# Patient Record
Sex: Female | Born: 1960 | Race: Black or African American | Hispanic: No
Health system: Southern US, Community
[De-identification: ages and names within clinical notes are randomized; demographics above are authoritative.]

---

## 2008-05-15 ENCOUNTER — Ambulatory Visit: Payer: Self-pay | Admitting: Family Medicine

## 2008-05-15 LAB — CONVERTED CEMR LAB
Basophils Absolute: 0 10*3/uL (ref 0.0–0.1)
CO2: 24 meq/L (ref 19–32)
Cholesterol: 215 mg/dL — ABNORMAL HIGH (ref 0–200)
Creatinine, Ser: 0.8 mg/dL (ref 0.40–1.20)
Eosinophils Relative: 1 % (ref 0–5)
Glucose, Bld: 79 mg/dL (ref 70–99)
HCT: 33 % — ABNORMAL LOW (ref 36.0–46.0)
Hemoglobin: 10.4 g/dL — ABNORMAL LOW (ref 12.0–15.0)
Lymphocytes Relative: 43 % (ref 12–46)
Lymphs Abs: 1.7 10*3/uL (ref 0.7–4.0)
MCV: 77.1 fL — ABNORMAL LOW (ref 78.0–100.0)
Monocytes Absolute: 0.3 10*3/uL (ref 0.1–1.0)
RDW: 15.8 % — ABNORMAL HIGH (ref 11.5–15.5)
Total Bilirubin: 1.5 mg/dL — ABNORMAL HIGH (ref 0.3–1.2)
Total CHOL/HDL Ratio: 4.6
Triglycerides: 111 mg/dL (ref <150)
VLDL: 22 mg/dL (ref 0–40)

## 2008-05-16 ENCOUNTER — Ambulatory Visit (HOSPITAL_COMMUNITY): Admission: RE | Admit: 2008-05-16 | Discharge: 2008-05-16 | Payer: Self-pay | Admitting: Family Medicine

## 2008-05-16 ENCOUNTER — Encounter: Payer: Self-pay | Admitting: Family Medicine

## 2008-05-17 ENCOUNTER — Encounter: Payer: Self-pay | Admitting: Family Medicine

## 2008-05-17 LAB — CONVERTED CEMR LAB
Ferritin: 8 ng/mL — ABNORMAL LOW (ref 10–291)
Saturation Ratios: 33 % (ref 20–55)

## 2008-05-26 ENCOUNTER — Emergency Department (HOSPITAL_COMMUNITY): Admission: EM | Admit: 2008-05-26 | Discharge: 2008-05-26 | Payer: Self-pay | Admitting: Emergency Medicine

## 2008-06-02 ENCOUNTER — Inpatient Hospital Stay (HOSPITAL_COMMUNITY): Admission: EM | Admit: 2008-06-02 | Discharge: 2008-06-06 | Payer: Self-pay | Admitting: Emergency Medicine

## 2008-06-06 ENCOUNTER — Encounter (INDEPENDENT_AMBULATORY_CARE_PROVIDER_SITE_OTHER): Payer: Self-pay | Admitting: *Deleted

## 2008-06-26 ENCOUNTER — Inpatient Hospital Stay (HOSPITAL_COMMUNITY): Admission: EM | Admit: 2008-06-26 | Discharge: 2008-07-14 | Payer: Self-pay | Admitting: Emergency Medicine

## 2008-06-27 ENCOUNTER — Encounter (INDEPENDENT_AMBULATORY_CARE_PROVIDER_SITE_OTHER): Payer: Self-pay | Admitting: *Deleted

## 2008-06-28 ENCOUNTER — Ambulatory Visit: Payer: Self-pay | Admitting: Internal Medicine

## 2008-06-29 ENCOUNTER — Encounter (INDEPENDENT_AMBULATORY_CARE_PROVIDER_SITE_OTHER): Payer: Self-pay | Admitting: *Deleted

## 2008-06-30 ENCOUNTER — Encounter (INDEPENDENT_AMBULATORY_CARE_PROVIDER_SITE_OTHER): Payer: Self-pay | Admitting: *Deleted

## 2008-07-01 ENCOUNTER — Encounter (INDEPENDENT_AMBULATORY_CARE_PROVIDER_SITE_OTHER): Payer: Self-pay | Admitting: *Deleted

## 2008-07-03 ENCOUNTER — Encounter (INDEPENDENT_AMBULATORY_CARE_PROVIDER_SITE_OTHER): Payer: Self-pay | Admitting: *Deleted

## 2008-07-05 ENCOUNTER — Encounter (INDEPENDENT_AMBULATORY_CARE_PROVIDER_SITE_OTHER): Payer: Self-pay | Admitting: *Deleted

## 2008-07-08 ENCOUNTER — Encounter: Payer: Self-pay | Admitting: Gastroenterology

## 2008-07-14 ENCOUNTER — Encounter (INDEPENDENT_AMBULATORY_CARE_PROVIDER_SITE_OTHER): Payer: Self-pay | Admitting: *Deleted

## 2008-07-23 ENCOUNTER — Encounter: Admission: RE | Admit: 2008-07-23 | Discharge: 2008-07-23 | Payer: Self-pay | Admitting: Family Medicine

## 2008-08-07 DIAGNOSIS — N921 Excessive and frequent menstruation with irregular cycle: Secondary | ICD-10-CM

## 2008-08-07 DIAGNOSIS — J189 Pneumonia, unspecified organism: Secondary | ICD-10-CM | POA: Insufficient documentation

## 2008-08-07 DIAGNOSIS — D649 Anemia, unspecified: Secondary | ICD-10-CM

## 2008-08-07 DIAGNOSIS — R197 Diarrhea, unspecified: Secondary | ICD-10-CM

## 2008-08-07 DIAGNOSIS — Z87448 Personal history of other diseases of urinary system: Secondary | ICD-10-CM

## 2008-08-07 DIAGNOSIS — A0472 Enterocolitis due to Clostridium difficile, not specified as recurrent: Secondary | ICD-10-CM

## 2008-08-12 ENCOUNTER — Ambulatory Visit: Payer: Self-pay | Admitting: Internal Medicine

## 2008-08-16 ENCOUNTER — Ambulatory Visit: Payer: Self-pay | Admitting: Family Medicine

## 2008-08-16 ENCOUNTER — Encounter: Payer: Self-pay | Admitting: Family Medicine

## 2008-08-16 LAB — CONVERTED CEMR LAB: GC Probe Amp, Genital: NEGATIVE

## 2008-10-10 ENCOUNTER — Encounter (INDEPENDENT_AMBULATORY_CARE_PROVIDER_SITE_OTHER): Payer: Self-pay | Admitting: *Deleted

## 2008-10-16 ENCOUNTER — Telehealth: Payer: Self-pay | Admitting: Internal Medicine

## 2008-10-17 ENCOUNTER — Ambulatory Visit: Payer: Self-pay | Admitting: Internal Medicine

## 2008-10-17 DIAGNOSIS — R142 Eructation: Secondary | ICD-10-CM

## 2008-10-17 DIAGNOSIS — R143 Flatulence: Secondary | ICD-10-CM

## 2008-10-17 DIAGNOSIS — Z8719 Personal history of other diseases of the digestive system: Secondary | ICD-10-CM | POA: Insufficient documentation

## 2008-10-17 DIAGNOSIS — R141 Gas pain: Secondary | ICD-10-CM

## 2008-10-18 ENCOUNTER — Telehealth: Payer: Self-pay | Admitting: Nurse Practitioner

## 2008-11-05 ENCOUNTER — Telehealth: Payer: Self-pay | Admitting: Internal Medicine

## 2008-11-08 ENCOUNTER — Ambulatory Visit: Payer: Self-pay | Admitting: Internal Medicine

## 2008-11-15 ENCOUNTER — Encounter: Payer: Self-pay | Admitting: Internal Medicine

## 2008-11-22 ENCOUNTER — Telehealth: Payer: Self-pay | Admitting: Internal Medicine

## 2008-11-26 ENCOUNTER — Ambulatory Visit: Payer: Self-pay | Admitting: Internal Medicine

## 2008-12-02 ENCOUNTER — Telehealth (INDEPENDENT_AMBULATORY_CARE_PROVIDER_SITE_OTHER): Payer: Self-pay | Admitting: *Deleted

## 2008-12-02 ENCOUNTER — Encounter: Payer: Self-pay | Admitting: Internal Medicine

## 2008-12-24 ENCOUNTER — Telehealth: Payer: Self-pay | Admitting: Internal Medicine

## 2010-07-09 IMAGING — CT CT PELVIS W/ CM
2 of 5 series · 17 of 46 positions shown, 19 images · IV contrast (APPLIED)
Comparison: None

CT ABDOMEN

CLINICAL DATA: Abdominal pain.  Blood in stool.  Fever.

CT ABDOMEN AND PELVIS WITH CONTRAST
TECHNIQUE: Multidetector CT imaging of the abdomen and pelvis was
performed using the standard protocol following bolus
administration of intravenous contrast.
Contrast: 80 ml 5mnipaque-LTT

[Series 2: abd/pelv with 5.0 b31f st · axial · 0.62mm/px · z∈[-572,-182]mm · 14 of 88 slices shown, 16 images]
[im 5/88  soft-tissue]
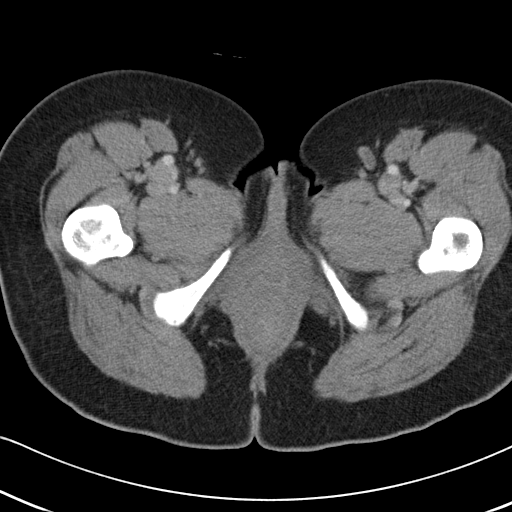
[im 5/88  bone]
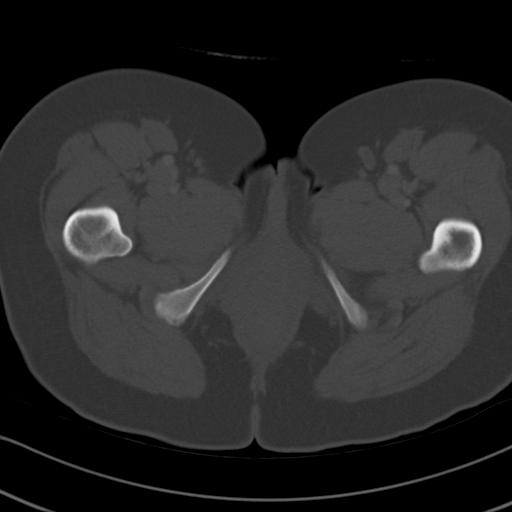
[im 10/88  soft-tissue]
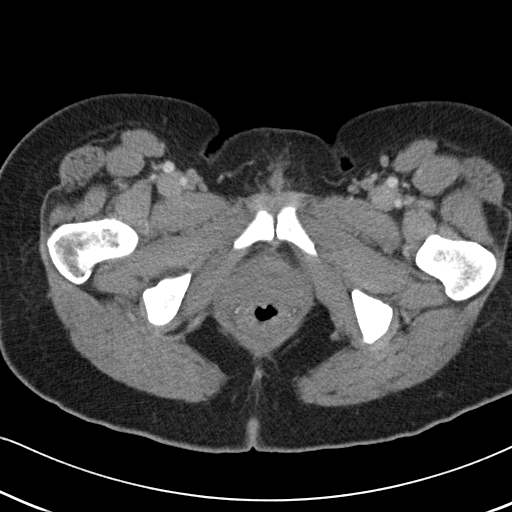
[im 20/88  soft-tissue]
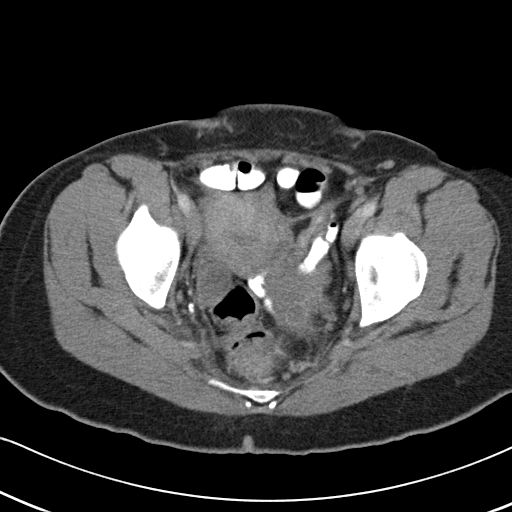
[im 25/88  soft-tissue]
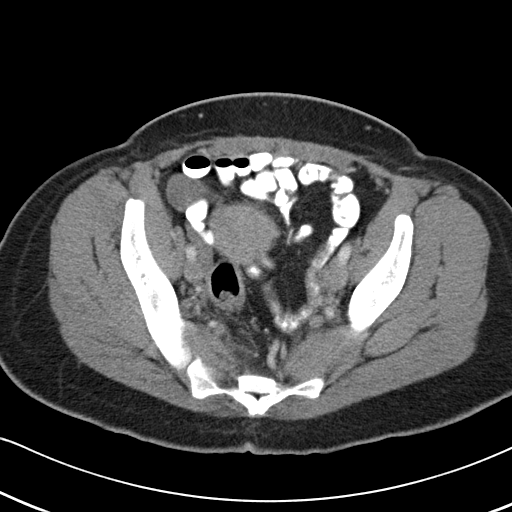
[im 30/88  soft-tissue]
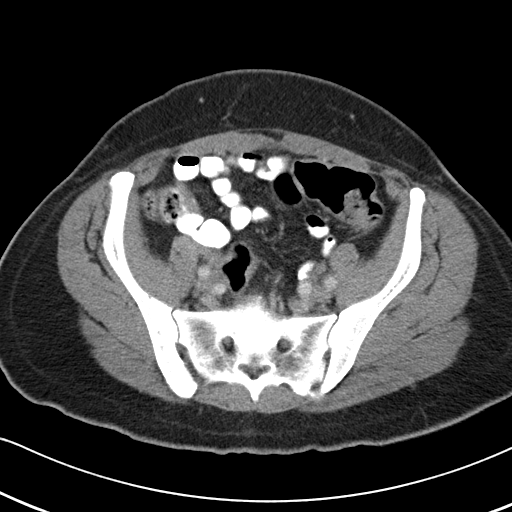
[im 34/88  soft-tissue]
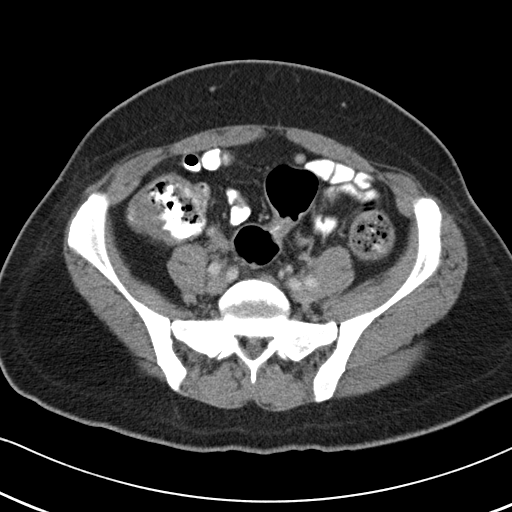
[im 39/88  soft-tissue]
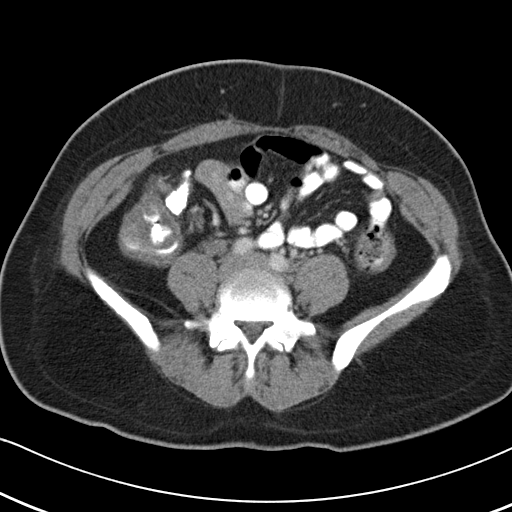
[im 49/88  soft-tissue]
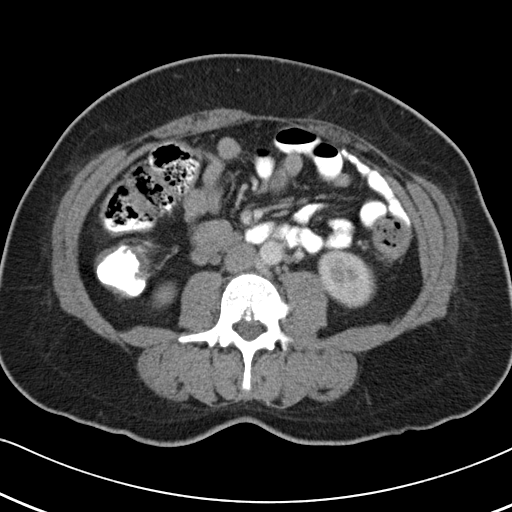
[im 54/88  soft-tissue]
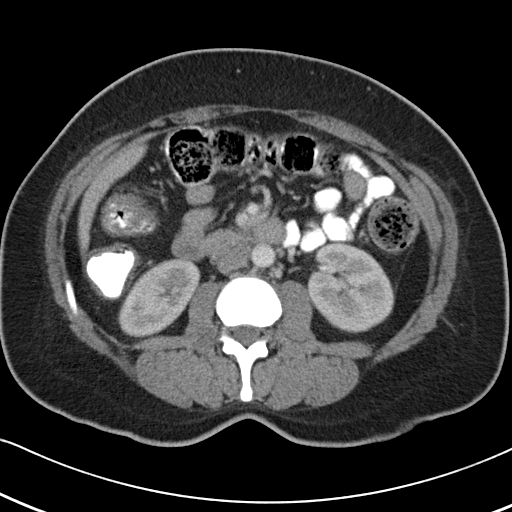
[im 54/88  bone]
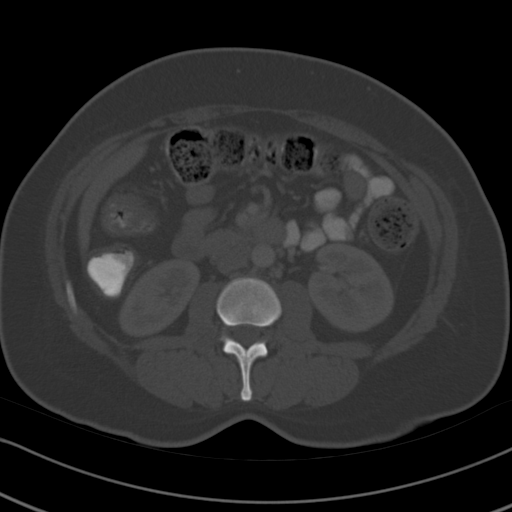
[im 59/88  soft-tissue]
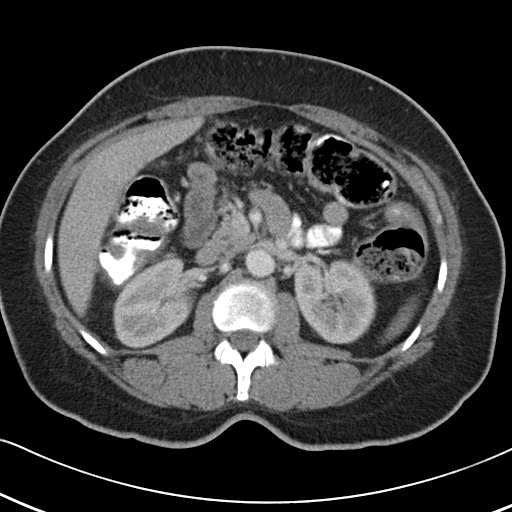
[im 63/88  soft-tissue]
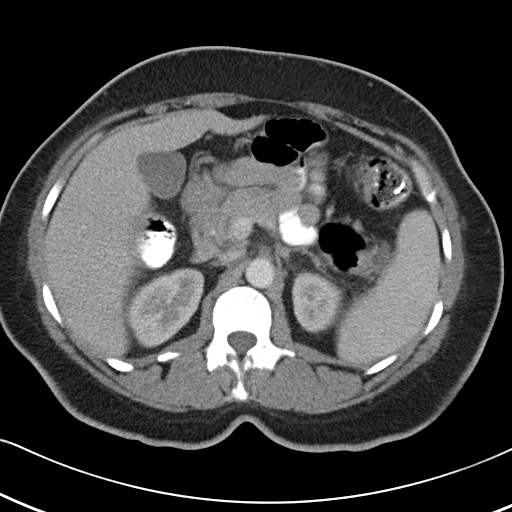
[im 68/88  soft-tissue]
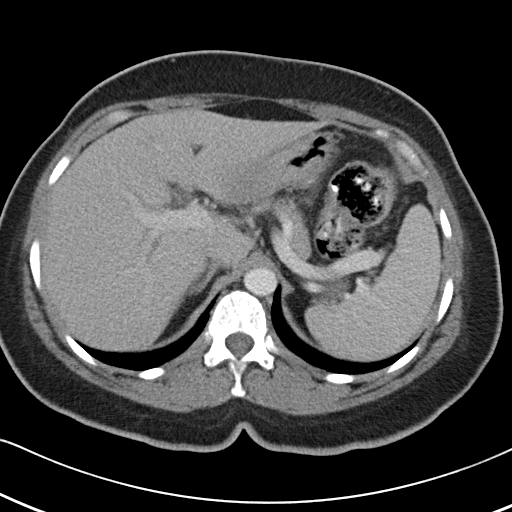
[im 78/88  soft-tissue]
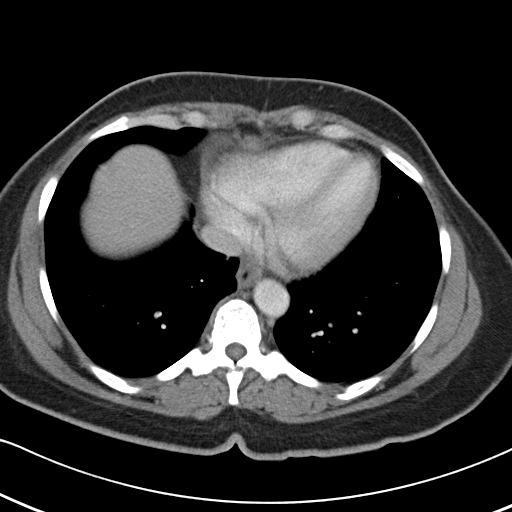
[im 83/88  soft-tissue]
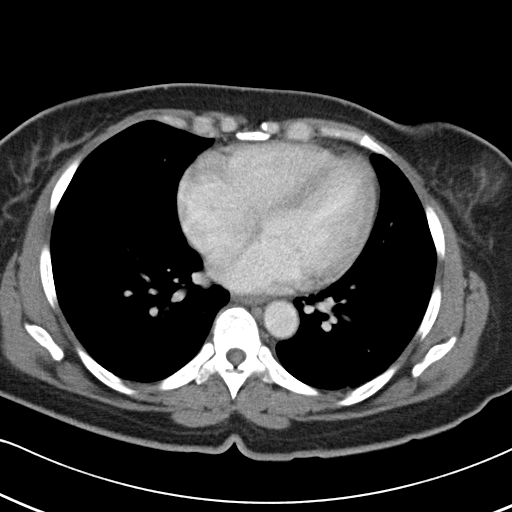

[Series 603: cor a/p · coronal · 0.86mm/px · 3 of 65 slices shown]
[im 22/65  soft-tissue]
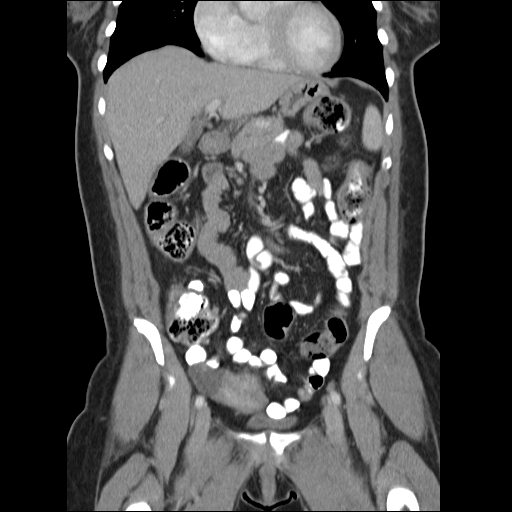
[im 29/65  soft-tissue]
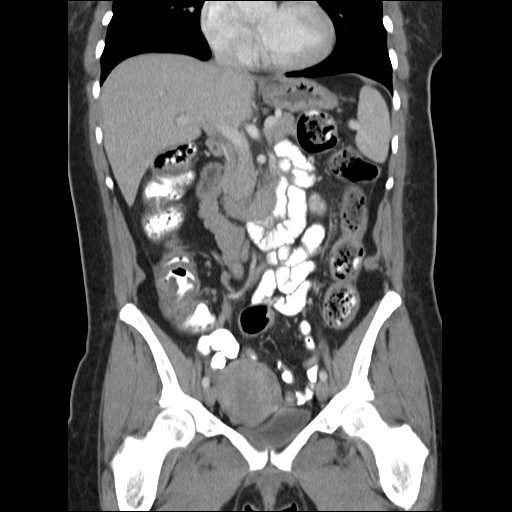
[im 36/65  soft-tissue]
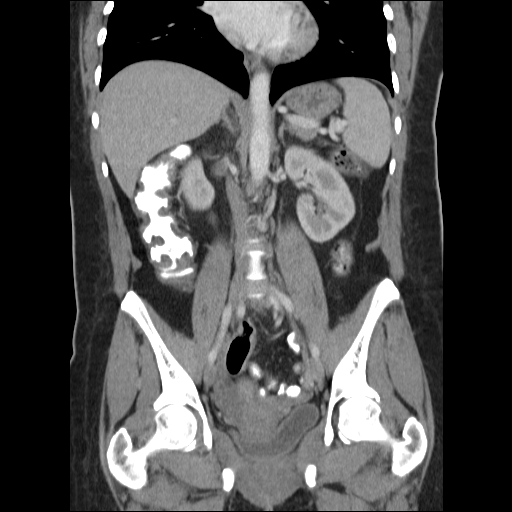

[17 of 46 positions shown; findings below may reference images not displayed]

FINDINGS: Lung bases are clear.  No pleural or pericardial fluid.
The liver has a normal appearance without focal lesions or biliary
ductal dilatation.  No calcified gallstones.  The spleen is normal.
The pancreas is normal.  The adrenal glands are normal.  The
kidneys are normal.  The aorta and IVC are normal.  No
retroperitoneal mass or adenopathy.  No free intraperitoneal fluid
or air.  There is marked thickening of the ascending colon.  This
appears to be a long segment in nature and is most consistent with
colitis.  Mass lesion and ischemic colitis are possible but
seemingly less likely.
IMPRESSION: Colitis effecting the right colon.  This is presumably related to
inflammatory bowel disease or infectious colitis.  Ischemic colitis
and neoplastic involvement are felt to be considerably less likely.

CT PELVIS
FINDINGS: No free fluid in the pelvis.  The uterus and adnexal
regions are unremarkable.  There is a 2 cm cyst of the right ovary
not likely of any clinical relevance.  No bowel pathology noted in
the pelvis.
IMPRESSION: Negative CT scan of the pelvis

## 2010-08-16 ENCOUNTER — Encounter: Payer: Self-pay | Admitting: Family Medicine

## 2010-08-25 NOTE — Letter (Signed)
Summary: Patient Dismissal Activation Form  Patient Dismissal Activation Form   Imported By: Lenard Forth 08/06/2009 13:04:24  _____________________________________________________________________  External Attachment:    Type:   Image     Comment:   External Document

## 2010-12-03 ENCOUNTER — Telehealth: Payer: Self-pay | Admitting: Internal Medicine

## 2010-12-03 NOTE — Telephone Encounter (Signed)
Dismissal letter sent out by registered/certified mail 11/30/10.

## 2010-12-08 NOTE — H&P (Signed)
[name], [name]               ACCOUNT NO.:  [id number]   MEDICAL RECORD NO.:  [id number]          PATIENT TYPE:  INP   LOCATION:  3029                         FACILITY:  [facility]   PHYSICIAN:  Eduard Clos, MDDATE OF BIRTH:  1960-11-17   DATE OF ADMISSION:  06/01/2008  DATE OF DISCHARGE:                              HISTORY & PHYSICAL   PRIMARY CARE PHYSICIAN:  [facility].   CHIEF COMPLAINT:  Abdominal pain, nausea and vomiting and diarrhea.   HISTORY OF PRESENT ILLNESS:  A 50 year old female with known history of  iron deficiency anemia on iron tablets presented to the ER complaining  of increasing nausea, vomiting and abdominal pain over the last 1 week.  The patient was recently treated 2 weeks ago for UTI with Bactrim.  The  patient after the treatment for 1 week started developing nausea,  vomiting and abdominal pain which is becoming more intense.  The  abdominal pain is diffuse and is constant, not  with food, though food  does sometimes increase the pain.  The patient has been having  increasing frequency of diarrhea.  The patient also has noticed nausea  and vomiting.  In the ER the patient had a CAT scan which was showing  right-sided colitis and has been admitted for further management of her  colitis.  The patient denies any chest pain, shortness of breath,  weakness of limbs, loss of consciousness, fever or chills or headache.   PAST MEDICAL HISTORY:  Iron deficiency anemia.   PAST SURGICAL HISTORY:  None.   MEDICATIONS:  The patient states she takes iron tablets since age 14.   ALLERGIES:  No known drug allergies.   SOCIAL HISTORY:  The patient is a Engineer, water.  Denies smoking cigarettes.  Drinking alcohol 9 years ago.  Denies any drug abuse.   REVIEW OF SYSTEMS:  As per in history of present illness.  Nothing else  significant.   PHYSICAL EXAMINATION:  GENERAL:  The patient was examined at bedside.  Not in acute distress.  VITAL SIGNS:  Blood  pressure is 123/70, pulse 97 per minute,  respirations 18, O2 sat 98%.  HEENT:  Anicteric.  No pallor.  CHEST:  Bilateral air entry present.  No rhonchi.  No crepitation.  HEART:  S1, S2 heard.  ABDOMEN:  Soft.  Very mild tenderness paraumbilically.  No guarding, no  rigidity.  CNS:  Alert, awake, oriented to time, place and person.  Moves upper and  lower limbs 5/5.  EXTREMITIES:  Peripheral pulses felt.  No edema.   LABS:  CT of abdomen and pelvis with contrast shows colitis evident in  the right colon presumably related to inflammatory bowel disease or  infectious colitis.  neoplastic  are felt to be considerably less  likely.  CT pelvis negative.  CBC - WBC 6.8, hemoglobin 8.6, hematocrit  26.6, platelets 255, neutrophils 65%.  Complete metabolic panel - sodium  138, potassium 3, chloride 108, carbon dioxide 22, glucose 107, BUN 5,  creatinine 0.6, total bilirubin 0.4, alkaline phosphatase 39, AST 17,  ALT 11, total protein 6.4, albumin 3.3, calcium 8.5,  lactic acid 1,  lipase 15.  UA negative for nitrites and leukocytes.  Stool for blood is  positive.  Cultures of urine are pending.   ASSESSMENT:  1. Colitis, probably antibiotic related.  2. Iron deficiency anemia.  3. Dehydration.   PLAN:  Admit the patient to medical floor.  Will continue with IV  fluids, Cipro and Flagyl.  Run a pregnancy screen.  The patient at this  time states she will have a blood transfusion if it is really needed.  Will follow CBC carefully and further recommendation as the patient's  condition evolves.      Eduard Clos, MD  Electronically Signed     ANK/MEDQ  D:  06/02/2008  T:  06/02/2008  Job:  [id number]

## 2010-12-08 NOTE — Group Therapy Note (Signed)
[name], [name]               ACCOUNT NO.:  [id number]   MEDICAL RECORD NO.:  [id number]          PATIENT TYPE:  INP   LOCATION:  5509                         FACILITY:  [facility]   PHYSICIAN:  Elliot Cousin, M.D.    DATE OF BIRTH:  1961-02-24                                 PROGRESS NOTE   Please see the previous progress note summary dictated by Dr. Lonia Blood.  This is an addendum.   CONSULTATIONS:  1. Stan Head, MD and Lina Sar, MD.   PROCEDURE PERFORMED:  1. Abdominal x-ray on July 05, 2008.  The results revealed bowel      gas pattern remains nonspecific.  Dilated loops of small bowel,      none containing air.  No evidence for high-grade bowel obstruction.  2. Chest x-ray on July 03, 2008.  The results revealed      hypoinflation with atelectasis and air space disease involving the      right lung mid and lower aspects.  PICC is in place with no      pneumothorax.  Mild stable enlargement of the cardiac silhouette.  3. Abdominal x-ray on July 03, 2008.  The results revealed mild      gaseous and fluid distention of a few loops of small bowel seen.      No pneumoperitoneum is evident.   HOSPITAL COURSE:  1. PAN COLITIS SECONDARY TO C.  DIFFICILE AND ASSOCIATED MILD ILEUS      AND HEMATOCHEZIA.  The  patient continues to be treated with oral      vancomycin and probiotics.  Intravenous Flagyl was discontinued      several days ago, primarily because of ongoing nausea.  Because of      an increasing abdominal girth, an abdominal x-ray was ordered on      July 03, 2008, and it revealed changes consistent with a mild      ileus.  She had been started on clear liquids several days ago.      She remained on clear liquids for several more days.  Following      resolution of her nausea, her diet was advanced to a low-lactose,      low-residue, full liquid diet.  A follow-up x-ray of her abdomen on      July 05, 2008 revealed no acute changes.  However, on  exam the      patient's abdomen appeared less distended.  The patient also      acknowledged that she felt less distended.  She continues to have      loose stools, some blood tinged.  The extent of her abdominal pain      has subsided.  Another C.  Diff toxin was ordered on December 9,      and it was negative.  As of today, the patient has been on      treatment with oral vancomycin for 12 days.  She will continue to      be monitored closely for resolution of the colitis.  The need for a  follow-up CT scan of the abdomen and pelvis or further evaluation      with a colonoscopy will be deferred to the gastroenterologist.      [place] gastroenterologists, including Dr. Leone Payor and Dr. Juanda Chance,      continue to follow the patient throughout the hospitalization.  2. ACUTE BLOOD LOSS ANEMIA.  The patient's hemoglobin has drifted down      to 7.7 as of today.  Over the past 2 days, it has ranged from 7.4      to 7.7.  Iron studies were ordered on July 04, 2008 and      revealed a total iron of 17, TIBC of 142, percent saturation of 12,      and a ferritin of 90.  Blood transfusions were offered to the      patient over the last several days.  However, she has some      apprehension about blood transfusions.  I did discuss the risks and      benefits.  However, at this point, she continues to refuse      treatment with the blood transfusions.  3. PNEUMONIA. The patient was started on Zosyn on July 01, 2008 for      an increase in shortness of breath.  A chest x-ray was ordered and      it revealed worsening bilateral air space opacities and a right      pleural effusion.  The VQ scan as previously indicated revealed an      indeterminate probability of PE.  A follow-up chest x-ray on      December 9 revealed ongoing right-sided air space disease.  The      patient is currently oxygenating 100% on room air.  She is less      symptomatic.  Her white blood cell count has normalized.   After 7      days of Zosyn therapy, it will be discontinued.  Rocephin will be      restarted for an additional 3 days and then stopped.  4. FUNGAL URINARY TRACT INFECTION.  The patient complained of an      uncomfortable Foley catheter.  An urinalysis was ordered and the      Foley catheter was discontinued several days ago.  The urine      culture grew out greater than 100,000 colonies of yeast.  She was      started on intravenous Diflucan 2 days ago.  She is currently less      symptomatic.  5. PERIPHERAL EDEMA.  The patient complained of swelling.  The IV      fluids were tapered down, and she was given several doses of      intravenous Lasix.  Following the treatment, the patient does have      less edema.  Further lasix can be given as needed.  6. HYPOMAGNESEMIA, HYPOKALEMIA, AND MILD HYPERKALEMIA.  The patient      has had several electrolyte abnormalities, including low magnesium,      low potassium, and now a slightly elevated serum potassium.  She      has been repleted not only through the TNA but also orally and via      the IV fluids.  As of today, her serum potassium is 5.2.  Her      magnesium was 1.8 on July 05, 2008.  The potassium chloride      will be discontinued in the IV fluids today.  She will be given a      small empiric dose of Lasix.  Her electrolytes will continue to be      followed closely.  7. MALNUTRITION.  The patient was started on TNA earlier in the      hospital course.  She remains on TNA for nutritional support.   DISCHARGE DISPOSITION:  The patient is not quite ready for hospital discharge yet.  She will be  monitored closely over the next several days.  As her symptoms subside,  a determination will be made for discharge.  The patient will decide on  receiving a blood transfusion as she makes her self-assessment daily.      Elliot Cousin, M.D.  Electronically Signed     DF/MEDQ  D:  07/07/2008  T:  07/07/2008  Job:  [id number]   cc:    Cecil Cranker, MD, [facility]  [address]  [address]  [place], [place]  [address]   Currie Paris, M.D.  [address]  [place]  [place] [address]

## 2010-12-08 NOTE — Consult Note (Signed)
NAMEADILENY, DELON               ACCOUNT NO.:  [id number]   MEDICAL RECORD NO.:  [id number]          PATIENT TYPE:  INP   LOCATION:                               FACILITY:  [facility]   PHYSICIAN:  Sharlet Salina T. Hoxworth, M.D.DATE OF BIRTH:  [birth year]   DATE OF CONSULTATION:  DATE OF DISCHARGE:                                 CONSULTATION   CHIEF COMPLAINT:  Abdominal pain, diarrhea.   HISTORY OF PRESENT ILLNESS:  I was asked by Dr. Sabino Gasser to evaluate  Ms. Strahm.  She is a pleasant 50 year old African American female who  was admitted to the [facility] 2 days ago.  She has a history of C.  diff colitis for which she was hospitalized 1 month ago and spent  several days in the hospital.  This responded appropriately and she  completed a course of outpatient antibiotics and felt that she was  essentially over this illness.  She was back at work and then about 1  day prior to this admission, she developed the onset of diffuse  abdominal pain and frequent diarrhea.  She describes quite severe pain  across her abdomen a little more on the left side with some radiation to  her back and rectum.  This was associated with frequent watery diarrhea  that also by her description had some mucus and blood as well.  She then  was admitted on June 27, 2008.  A CT scan at that time which I  reviewed shows a diffuse pancolitis with significant thickening of the  bowel wall and fluid filled colon.  The patient was begun on oral Flagyl  and vancomycin at that time 2 days ago.   Since admission she has failed to improve.  By her own estimation, the  pain may be slightly better, but is still significant.  She is still  having frequent diarrhea.  She has had progressive elevation of white  count and worsening of her renal function as described below.  She has  had nausea and occasional vomiting.  TNA is being started today.  She  has had some intermittent fever.   PAST MEDICAL HISTORY:  She has  generally been in good health.  Her only  previous surgery is C-section.  She has had severe menometrorrhagia and  anemia.  Otherwise no serious illness.  Her only medication prior to  admission was iron.  She is currently on Flagyl 500 mg IV q.8 h,  Florastor 250 mg b.i.d., Lactinex 2 tabs, and vancomycin liquid 250 mg  daily, as well as pain and nausea medication.   ALLERGIES:  No known drug allergies   SOCIAL HISTORY:  No cigarettes or alcohol.  She is employed as a Research scientist (medical).   REVIEW OF SYSTEMS:  Positive, general for fever, fatigue, and some  weight loss.  RESPIRATORY:  Shortness of breath, cough, wheezing.  CARDIAC:  No chest pain, palpitations, or history of heart disease.  EXTREMITIES:  No joint swelling, edema.   PHYSICAL EXAMINATION:  VITAL SIGNS:  She is afebrile.  Heart rate 100,  blood pressure 110/69,  respirations 20, T-max is 101 over the last 24  hours.  GENERAL:  She is an alert well-developed [place] American female who  appears somewhat ill.  SKIN:  Warm and dry.  No rash or infection.  HEENT:  No palpable mass or thyromegaly.  Lymph nodes nonpalpable.  LUNGS:  Clear without wheezing or increased work of breathing.  CARDIAC:  Regular mild tachycardia.  No murmurs.  No edema.  ABDOMEN:  Mild distention.  Bile sounds are hypoactive.  There is  moderate diffuse tenderness, some guarding, but no evidence of  peritoneal irritation.  No discernible masses or organomegaly.  EXTREMITIES:  No joint swelling, no deformity, or edema.  NEUROLOGIC:  Alert, fully oriented.   LABORATORY:  White blood count has gradually climbed and today is 21.2  thousand, hemoglobin 10.5.  Chemistries show normal electrolytes with  gradually worsening renal function, normal on admission now, BUN 39,  creatinine 2.97.  LFTs abnormal for alkaline phosphatase of 31, albumin  is 3.1.  Urinalysis unremarkable.   CT scan of the abdomen as above.   KUB today shows some mildly dilated  colon, no evidence of free air.   ASSESSMENT AND PLAN:  Severe recurrent apparent C.  Diff colitis.  Her  physical exam is not unusually worrisome, but she has progressively  increased white count and worsening renal function.  She appears to be  on maximal medical therapy.  If she continues to worsen, she may well  require emergency colectomy.  Her urine output is difficult to follow  due to her diarrhea.  I have ordered Foley catheter for urine output  monitoring and fluid bolus.  We will follow closely with you.      Lorne Skeens. Hoxworth, M.D.  Electronically Signed     BTH/MEDQ  D:  06/29/2008  T:  06/29/2008  Job:  [id number]

## 2010-12-08 NOTE — Group Therapy Note (Signed)
NAMEXITLALY, AULT               ACCOUNT NO.:  [id number]   MEDICAL RECORD NO.:  [id number]          PATIENT TYPE:  INP   LOCATION:  2116                         FACILITY:  [facility]   PHYSICIAN:  Lonia Blood, M.D.      DATE OF BIRTH:  30-Aug-1960                                 PROGRESS NOTE   PRIMARY CARE PHYSICIAN:  Patient is unassigned to [place].   DISCHARGE DIAGNOSES:  1. Severe Clostridium difficile colitis.  2. Hospital-acquired pneumonia.  3. Acute renal failure.  4. Hypokalemia.  5. Leukocytosis.  6. Severe protein calorie malnutrition.  7. Mild altered mental status on admission.  8. Iron deficiency anemia.   DISCHARGE MEDICATIONS:  To be dictated at time of discharge.   DISPOSITION:  Also to be dictated at time of discharge.   PROCEDURES PERFORMED:  Acute abdominal series that showed marked thickening of the wall of the  transverse colon with thumbprinting, consistent with colitis.  Prior  colitis was involving the right colon.  No free air.  No acute  cardiopulmonary disease.  CT abdomen and pelvis on 06/27/2008 showed pan-  colitis with marked progression of the colonic inflammation since prior  studies in a month.  The entire colon is now involved.  Pseudomembranous  colitis was most likely offender.  Followup abdominal x-ray on  06/29/2008 showed mild gaseous distention of the transverse colon and  proximal descending colon  bowel wall.  Another abdominal x-ray on  06/30/2008 showed residual gaseous distention of predominantly the colon  with air-fluid level, colonic bowel wall thickening consistent with  known colitis.  Chest x-ray on 06/30/2008 showed PICC line in the right  place.  There was a right base collapse or consolidation with right  pleural effusion.  A followup chest x-ray on 07/01/2008 also showed  worsening bilateral air space opacities and right-sided effusion.  Nuclear medicine ventilation/perfusion scan was indeterminate, performed  on 07/01/2008.   Followup abdominal x-ray on 07/01/2008 showed stable  colonic wall thickening compatible with colitis, but no evidence of  bowel obstruction or perforation.   CONSULTATIONS:  1. Dr. Corinda Gubler, Gastroenterology.  2. General Surgery, Dr. Jamey Ripa.   BRIEF HISTORY AND PHYSICAL:  Please refer to dictated history and physical on admission by Dr.  Buena Irish.  The patient is a 50 year old female who had only  recently had C. diff colitis.  She was treated on discharge.  The  patient returned with abdominal pain, severe in nature, as well as  diarrhea.  The diarrhea has gotten worse since June 06, 2008, after  she was discharged.  She was found to have altered mental status at that  time.  She was very tachypneic and had a temperature of 102.6.  The  patient was subsequently admitted and initiated on treatment.   HOSPITAL COURSE:  1. Severe C. diff colitis.  Again, this is recurrent.  The patient was      started on oral Flagyl, as well as vancomycin.  Other medications      added included Lactinex, as well as GI consult.  Patient was also  added b.i.d.  The patient's response to treatment was very slow to      the extent that Surgery was consulted for possible colectomy if it      did not improve.  She has now started making some improvement.      Bowel rest has continued with no oral intake, but TPN instead.  The      patient will continue treatment with followup by both GI and      Surgery.  2. Pneumonia.  On 07/01/2008, the patient was having shortness of      breath and tachypnea.  V/Q scan was indeterminate, however,      followup chest x-ray indicated the patient may have bilateral      pneumonia.  With her recent hospital admission and current      hospitalization, this is classified as hospital-acquired.      Accordingly, IV vancomycin and Zosyn have now been added to her      regiment and followup is continued.  3. Hypokalemia, most likely from her diarrhea.  We  continued to      replete her potassium.  4. Acute renal failure.  The patient's BUN and creatinine rose      initially, probably from dehydration.  Her creatinine was as high      as 2.97.  This has since continuously declined, currently is 1.2.  5. Leukocytosis, again secondary to her infection.  Her white count      has risen, but is now trending downwards.  6. Protein calorie malnutrition.  The patient has been initiated on      total parenteral nutrition since she is unable to take p.o. now and      will continue to do that until p.o. intake is established.      Lonia Blood, M.D.  Electronically Signed     LG/MEDQ  D:  07/02/2008  T:  07/02/2008  Job:  [id number]   cc:   Cecil Cranker, MD, [facility]  [address]  [address]  [place], [place]  [address]   Currie Paris, M.D.  [address], [address]  [place]  [place] [address]

## 2010-12-08 NOTE — Discharge Summary (Signed)
[name], [name]               ACCOUNT NO.:  [id number]   MEDICAL RECORD NO.:  [id number]          PATIENT TYPE:  INP   LOCATION:  3029                         FACILITY:  [facility]   PHYSICIAN:  Altha Harm, MDDATE OF BIRTH:  [birth year]   DATE OF ADMISSION:  06/01/2008  DATE OF DISCHARGE:  06/06/2008                               DISCHARGE SUMMARY   DISCHARGE DISPOSITION:  Home.   FINAL DISCHARGE DIAGNOSES:  1. Clostridium difficile colitis.  2. Deficiency anemia.  3. Abdominal pain secondary to Clostridium difficile colitis.  4. Menometrorrhagia.   DISCHARGE MEDICATIONS:  1. Flagyl 500 mg p.o. q.8h. times 10 days.  2. Iron sulfate 325 mg p.o. b.i.d.  3. Questran 4 grams 1 tablet p.o. b.i.d. times 10 days.   CONSULTANTS:  None.   PROCEDURE:  None.   DIAGNOSTIC STUDIES:  CT of the abdomen and pelvis done on admission  which shows the following, colitis effecting the right colon.  This is  presumably related to inflammatory or infectious bowel disease.  Negative CT scan of the pelvis.   ALLERGIES:  No known drug allergies.   CODE STATUS:  Full code.   PRIMARY HEALTH [name]:  [facility].   CHIEF COMPLAINT:  Abdominal pain, nausea, vomiting and diarrhea.   HISTORY OF PRESENT ILLNESS:  Please refer to the H and P dictated by  Eduard Clos, MD for details of the HPI.   HOSPITAL COURSE:  1. The patient was admitted and started presumably on ciprofloxacin      and Flagyl for possible infectious colitis.  The patient had been      seen in the emergency room on November 1, where she had a stool      sample tested however, no therapy was recommended.  The stool      sample from that time showed to be C. diff positive.  The patient      was put on isolation and started treatment for C. diff positive      fluids with Flagyl.  Clinically, the patient improved with      resolution of her diarrhea and her mild dehydration.  The patient      was able to  tolerate diet without any difficulty.  The patient      still has occasional mild pain.  However, the pain has diminished      considerably over time.  The patient is being discharged home with      a dose of Flagyl 500 mg p.o. t.i.d. for a total of 10 additional      days to complete a full course of 14 days.  The patient is to      follow up with her primary care physician after the completion of      her medications.  2. Iron deficiency anemia secondary to menometrorrhagia.  The patient      has menometrorrhagia and significantly heavy periods.  Currently,      the patient is menstruating and has a hemoglobin which is 8.6 with      a hematocrit of 25.8.  The  patient relates that her hemoglobin      stays between 8- 9 usually.  The patient has been started on iron      supplementation and has been advised to see GYN counselor as on      outpatient.  Otherwise, the patient has remained stable without any      need for any other medications or any intervention.   DIETARY RESTRICTIONS:  The patient is on no dietary restrictions.   PHYSICAL RESTRICTIONS:  None.   FOLLOW UP:  The patient is to follow up with her [name] at  the completion of her Flagyl.      Altha Harm, MD  Electronically Signed     MAM/MEDQ  D:  06/06/2008  T:  06/06/2008  Job:  [id number]

## 2010-12-08 NOTE — H&P (Signed)
[name], [name]               ACCOUNT NO.:  [id number]   MEDICAL RECORD NO.:  [id number]          PATIENT TYPE:  INP   LOCATION:  5024                         FACILITY:  [facility]   PHYSICIAN:  Thomasenia Bottoms, MDDATE OF BIRTH:  1960/11/29   DATE OF ADMISSION:  06/26/2008  DATE OF DISCHARGE:                              HISTORY & PHYSICAL   CHIEF COMPLAINT:  Severe abdominal pain and diarrhea.   HISTORY OF PRESENT ILLNESS:  [name] is a 50 year old who presents  with severe abdominal pain.  She has had pain in her stomach, pelvis,  rectum and back.  She says that the pain started yesterday but really  has been severe today.  She started having diarrhea with lots of mucus  in it yesterday too.  She says these are the exact same symptoms she had  about a month ago when she was admitted to the hospital and found to  have C. difficile.  She took all of the antibiotics and said she was  feeling completely fine by the time she finished the antibiotics and was  doing well until two days ago.  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  She has  been washing her hands and had been very careful, but the diarrhea  started yesterday, so she is currently concerned that this is recurrent  C. difficile.  Her past medical history is significant for  menometrorrhagia and severe anemia.  She has C. Difficile and was  discharged June 06, 2008.   MEDICATIONS ON ARRIVAL:  Iron daily.  She does admit to take some  Imodium yesterday.  She has completed all the antibiotics.   FAMILY HISTORY:  Noncontributory.   REVIEW OF SYSTEMS:  The patient is unable because she is quite  lethargic.   SOCIAL HISTORY:  She does not smoke cigarettes, drink alcohol or use any  illicit drugs and works as a Civil engineer, contracting.  In the  emergency department on arrival, her temperature was 102.6, blood  pressure 131/84, pulse 124, respiratory  rate 23, O2 sats 99% on room  air.   PHYSICAL EXAMINATION:  GENERAL:  The patient is extremely lethargic.  HEENT:  Normocephalic, atraumatic.  Pupils equal round.  Sclerae  nonicteric.  Oral mucosa dry.  NECK:  Supple.  No lymphadenopathy, no thyromegaly, no jugular venous  distention.  CARDIAC:  Tachycardiac and regular.  No murmurs are appreciated.  LUNGS:  Clear to auscultation bilaterally.  No wheezes, no rhonchi or  rales.  ABDOMEN:  Tender diffusely.  She has hyperactive bowel sounds.  No  hepatosplenomegaly appreciated.  Her extremities reveal no evidence of  clubbing, cyanosis or pitting edema.  She has palpable DP pulses  bilaterally.  SKIN;  warm, dry and intact.  No open lesions.  No rashes.  MUSCULOSKELETAL:  Examination reveals no evidence of effusion of her  joints.  Fairly good range of motion.  NEUROLOGIC:  She is exceptionally lethargic, but her cranial nerves  reveal no facial asymmetry.  She does move each  of her extremities  symmetrically without evidence of motor asymmetry.  Her sensory exam  grossly appears to be intact.   LABORATORY DATA:  White count 9.9, hemoglobin 10.8, hematocrit 30.9,  platelet count 201.  Sodium is 139, potassium 2.9, chloride 101, BUN 5,  creatinine 0.9, glucose 103, AST 18, ALT 15, total bili is 0.8, alk phos  is 31, lipase is 10.  Urinalysis reveals amber urine, negative leukocyte  esterase, 0-2 WBCs, 0-2 RBCs, normal specific gravity.  The patient had  an acute abdominal series which reveals marked thickening of the wall of  the transverse colon and proximal descending colon with some printing.  Scattered air-fluid levels.  No evidence of free air.  Mild degenerative  changes within the lumbar spine.  Clear lungs.   ASSESSMENT/PLAN:  1. 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  The patient actually looks quite      ill.  She does have a high fever at this time, but her white count      is not elevated.  A CT scan of her abdomen has been  ordered.  She      drank the contrast, so that is pending.  Will start the patient on      vancomycin p.o.  This is only her first recurrence, but she looks      quite ill.  So, I will start the vancomycin, would consider getting      a GI consultation.  She clinically looks more ill than her white      blood cell count and labs suggest.  2. Altered level of consciousness.  Perhaps this is because of the      narcotics that she received for pain earlier.  This may be in part      with making her appear so ill, the lethargy, so we will just follow      her mental status.  The patient describes quite a bit of pain.  In      fact, I am going to hold off on her narcotics given the level of      her lethargy at this time.  Certainly will discontinue her Imodium.      Will send a stool for C. difficile, but treatment will be started      immediately empirically.      Thomasenia Bottoms, MD  Electronically Signed     CVC/MEDQ  D:  06/26/2008  T:  06/27/2008  Job:  [id number]   cc:   Melvern Banker

## 2010-12-08 NOTE — Discharge Summary (Signed)
[name], [name]               ACCOUNT NO.:  [id number]   MEDICAL RECORD NO.:  [id number]          PATIENT TYPE:  INP   LOCATION:  5509                         FACILITY:  [facility]   PHYSICIAN:  Marcellus Scott, MD     DATE OF BIRTH:  31-Jan-1961   DATE OF ADMISSION:  06/26/2008  DATE OF DISCHARGE:  07/14/2008                               DISCHARGE SUMMARY   PRIMARY CARE PHYSICIAN:  Gentry Fitz but will be given an appointment to  go to [facility].   PRIMARY GI PHYSICIAN:  Hedwig Morton. Juanda Chance, MD with  GI.   This is an addendum to the discharge Summary which was done by Dr.  Elliot Cousin on July 07, 2008.  This summary will update events  since then, including discharge diagnoses and medications.   FINAL DIAGNOSES:  1. Severe Clostridium difficile colitis/pseudomembranous colitis.  2. Chronic anemia, status post 2 units of packed red blood cell      transfusions.  3. Malnutrition.  4. Pneumonia - treated.  5. Fungal urinary tract infection - treated.  6. Electrolyte abnormalities - hyperkalemia and hypomagnesemia,      treated.   DISCHARGE MEDICATIONS:  1. Vancomycin 125 mg capsule, 1 p.o. four times a day for one week and      then 1 p.o. b.i.d. for one week and then 1 p.o. daily for one week      and then 1 p.o. alternate days for one week, then 1 p.o. every      third day for 2 weeks.  2. Florastor 250 mg p.o. b.i.d. for 4 weeks.  3. Questran in 4-8 ounces of liquid daily p.o. p.r.n.  4. Imodium 2 mg p.o. b.i.d. or t.i.d. p.r.n.   PROCEDURES:  Flexible sigmoidoscopy by Dr. Arlyce Dice on July 08, 2008.  Impression:  Mild inflammatory changes.  Chest x-ray on July 08, 2008.  Impression:  Improving right effusion  and basilar air space disease.   PERTINENT LABS:  CBC with hemoglobin 10.4, hematocrit 31.5, white blood  cells 7.8, MCV 82, platelet count 302,000.  Hemoglobin electrophoresis  with hemoglobin A2 2.9, hemoglobin F 0.7, hemoglobin S zero,  hemoglobin  others zero, hemoglobin A 96.4.  Clostridium difficile toxin on July 11, 2008 negative.  Basic metabolic profile on July 12, 2008 with  BUN 21, creatinine 1.10.   CONSULTANTS:  Barbette Hair. Arlyce Dice, MD,FACG - GI.   HOSPITAL COURSE AND DISPOSITION:  1. Severe Clostridium difficile colitis/pseudomembranous colitis:      Since July 08, 2008, the patient continued to have multiple      watery diarrhea.  She had been on TNA and vancomycin plus probiotic      treatment.  GI performed flexible sigmoidoscopy on July 08, 2008 which showed mild inflammatory changes.  Since then, her TNA      was stopped and diet was advanced. Also her Protonix was      discontinued.  The patient symptomatically continued to improve      with no abdominal pain or distention. However, she had early  satiety.  She continued to have diarrhea up to 10 times daily which      was watery.  I discussed with the GI PA two days back.  She      indicated that maybe the patient had developed an element of      irritable bowel syndrome and recommended discontinuing hyoscyamine      and starting her on Imodium which was done.  The patient indicates      that this has made a significant difference.  She still has      diarrhea but is significantly better than before.  She is      tolerating diet.  She indicates she is getting stronger and      ambulates.  The patient had a physical therapy/occupational therapy      evaluation and they do not see any home needs.  The patient will be      discharged on vancomycin, Florastor, Questran and Imodium at doses      suggested by the GI team. The patient will follow up with Dr.      Juanda Chance on August 12, 2008 at 9:30 a.m. (phone number [phone]).  2. Chronic anemia.  ? anemia of chronic disease, plus or minus acute      blood loss anemia.  The patient, for the first few days, had      declined transfusions.  Subsequently she did agree for transfusion       following which 2 units of packed red blood cells were transfused      with good effect.  Hemoglobin since has been stable. Her hemoglobin      electrophoresis prior to transfusion was not indicative of      hemoglobinopathy.  Recommend following CBC in one week's time.  The      patient was on iron supplements prior to admission.  Will hold this      until her GI status settles.  3. Malnutrition.  The patient has been recommended high nutritional      diet as an outpatient.  4. Pneumonia, treated and asymptomatic with patient being afebrile      with no leukocytosis and clear lungs.  5. Fungal urinary tract infection, treated.  6. Electrolyte abnormalities, resolved.   This patient unfortunately does not have a primary MD or insurance.  We  will provide a referral to [facility] where she is to make  an appointment to follow up with in one week with CBC.  Also, the  patient will be provided with 3 days of medications through the hospital  pharmacy.   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  difficile from her as well as being on antibiotics.  She is anxious and  reluctant to return to work with the same employer.   Time spent: 30 mins.      Marcellus Scott, MD  Electronically Signed     AH/MEDQ  D:  07/14/2008  T:  07/14/2008  Job:  [id number]   cc:   Hedwig Morton. Juanda Chance, MD  [facility]

## 2011-04-27 LAB — COMPREHENSIVE METABOLIC PANEL
ALT: 11
ALT: 18
AST: 15
AST: 27
Albumin: 3.3 — ABNORMAL LOW
Alkaline Phosphatase: 39
Alkaline Phosphatase: 51
BUN: 5 — ABNORMAL LOW
CO2: 18 — ABNORMAL LOW
CO2: 26
Calcium: 8.5
Chloride: 105
Chloride: 108
Creatinine, Ser: 0.65
GFR calc Af Amer: >60
GFR calc Af Amer: >60
GFR calc non Af Amer: >60
GFR calc non Af Amer: >60
Glucose, Bld: 107 — ABNORMAL HIGH
Glucose, Bld: 85
Potassium: 3 — ABNORMAL LOW
Potassium: 4
Sodium: 133 — ABNORMAL LOW
Sodium: 138
Total Bilirubin: 0.4
Total Bilirubin: 1.2
Total Protein: 6.1
Total Protein: 6.4

## 2011-04-27 LAB — PREGNANCY, URINE: Preg Test, Ur: NEGATIVE

## 2011-04-27 LAB — DIFFERENTIAL
Basophils Absolute: 0
Basophils Relative: 0
Basophils Relative: 0
Eosinophils Absolute: 0
Eosinophils Absolute: 0.1
Eosinophils Relative: 0
Lymphs Abs: 0.5 — ABNORMAL LOW
Monocytes Absolute: 0.5
Monocytes Relative: 7
Neutro Abs: 4.4
Neutrophils Relative %: 65
Neutrophils Relative %: 92 — ABNORMAL HIGH

## 2011-04-27 LAB — BASIC METABOLIC PANEL
CO2: 25
Calcium: 8.9
Calcium: 9.1
Creatinine, Ser: 0.72
GFR calc Af Amer: >60
GFR calc Af Amer: >60
GFR calc non Af Amer: >60
Glucose, Bld: 82
Sodium: 140

## 2011-04-27 LAB — CULTURE, BLOOD (ROUTINE X 2)

## 2011-04-27 LAB — URINALYSIS, ROUTINE W REFLEX MICROSCOPIC
Bilirubin Urine: NEGATIVE
Glucose, UA: NEGATIVE
Glucose, UA: NEGATIVE
Hgb urine dipstick: NEGATIVE
Ketones, ur: 15 — AB
Ketones, ur: NEGATIVE
Protein, ur: NEGATIVE
Protein, ur: NEGATIVE
Urobilinogen, UA: 0.2
pH: 6

## 2011-04-27 LAB — URINE CULTURE
Colony Count: NO GROWTH
Culture: NO GROWTH

## 2011-04-27 LAB — CBC
HCT: 26.6 — ABNORMAL LOW
Hemoglobin: 8.6 — ABNORMAL LOW
Hemoglobin: 8.6 — ABNORMAL LOW
Hemoglobin: 8.7 — ABNORMAL LOW
Hemoglobin: 9.8 — ABNORMAL LOW
MCHC: 32.4
MCHC: 32.7
MCHC: 32.8
MCHC: 33.4
MCV: 77.7 — ABNORMAL LOW
MCV: 79.2
Platelets: 279
RBC: 3.28 — ABNORMAL LOW
RBC: 3.32 — ABNORMAL LOW
RBC: 3.41 — ABNORMAL LOW
RBC: 3.79 — ABNORMAL LOW
RDW: 16.7 — ABNORMAL HIGH
RDW: 16.8 — ABNORMAL HIGH
RDW: 16.8 — ABNORMAL HIGH
RDW: 17.1 — ABNORMAL HIGH
WBC: 10.6 — ABNORMAL HIGH
WBC: 6.8

## 2011-04-27 LAB — TYPE AND SCREEN
ABO/RH(D): A POS
Antibody Screen: NEGATIVE

## 2011-04-27 LAB — LIPID PANEL
Cholesterol: 146
HDL: 25 — ABNORMAL LOW
Triglycerides: 61

## 2011-04-27 LAB — OCCULT BLOOD X 1 CARD TO LAB, STOOL: Fecal Occult Bld: NEGATIVE

## 2011-04-27 LAB — CLOSTRIDIUM DIFFICILE EIA: C difficile Toxins A+B, EIA: 3

## 2011-04-27 LAB — HCG, SERUM, QUALITATIVE: Preg, Serum: NEGATIVE

## 2011-04-27 LAB — STOOL CULTURE

## 2011-04-27 LAB — TSH: TSH: 5.358 — ABNORMAL HIGH

## 2011-04-30 LAB — COMPREHENSIVE METABOLIC PANEL
ALT: 28 U/L (ref 0–35)
ALT: 28 U/L (ref 0–35)
AST: 22 U/L (ref 0–37)
AST: 29 U/L (ref 0–37)
Albumin: 1.8 g/dL — ABNORMAL LOW (ref 3.5–5.2)
Albumin: 1.8 g/dL — ABNORMAL LOW (ref 3.5–5.2)
Albumin: 1.8 g/dL — ABNORMAL LOW (ref 3.5–5.2)
Albumin: 2.2 g/dL — ABNORMAL LOW (ref 3.5–5.2)
Alkaline Phosphatase: 44 U/L (ref 39–117)
Alkaline Phosphatase: 45 U/L (ref 39–117)
Alkaline Phosphatase: 52 U/L (ref 39–117)
BUN: 12 mg/dL (ref 6–23)
BUN: 15 mg/dL (ref 6–23)
BUN: 17 mg/dL (ref 6–23)
BUN: 19 mg/dL (ref 6–23)
BUN: 29 mg/dL — ABNORMAL HIGH (ref 6–23)
CO2: 27 mEq/L (ref 19–32)
Calcium: 7.7 mg/dL — ABNORMAL LOW (ref 8.4–10.5)
Calcium: 7.8 mg/dL — ABNORMAL LOW (ref 8.4–10.5)
Calcium: 8 mg/dL — ABNORMAL LOW (ref 8.4–10.5)
Calcium: 8 mg/dL — ABNORMAL LOW (ref 8.4–10.5)
Calcium: 8.9 mg/dL (ref 8.4–10.5)
Chloride: 103 mEq/L (ref 96–112)
Chloride: 119 mEq/L — ABNORMAL HIGH (ref 96–112)
Creatinine, Ser: 1.1 mg/dL (ref 0.4–1.2)
Creatinine, Ser: 1.22 mg/dL — ABNORMAL HIGH (ref 0.4–1.2)
Creatinine, Ser: 1.56 mg/dL — ABNORMAL HIGH (ref 0.4–1.2)
Creatinine, Ser: 2.03 mg/dL — ABNORMAL HIGH (ref 0.4–1.2)
GFR calc Af Amer: 43 mL/min — ABNORMAL LOW (ref >60)
GFR calc Af Amer: >60 mL/min (ref >60)
GFR calc non Af Amer: 26 mL/min — ABNORMAL LOW (ref >60)
GFR calc non Af Amer: 47 mL/min — ABNORMAL LOW (ref >60)
Glucose, Bld: 100 mg/dL — ABNORMAL HIGH (ref 70–99)
Glucose, Bld: 107 mg/dL — ABNORMAL HIGH (ref 70–99)
Potassium: 2.9 mEq/L — ABNORMAL LOW (ref 3.5–5.1)
Potassium: 4.7 mEq/L (ref 3.5–5.1)
Potassium: 5.4 mEq/L — ABNORMAL HIGH (ref 3.5–5.1)
Sodium: 134 mEq/L — ABNORMAL LOW (ref 135–145)
Sodium: 136 mEq/L (ref 135–145)
Total Bilirubin: 0.4 mg/dL (ref 0.3–1.2)
Total Bilirubin: 0.4 mg/dL (ref 0.3–1.2)
Total Protein: 4.4 g/dL — ABNORMAL LOW (ref 6.0–8.3)
Total Protein: 4.8 g/dL — ABNORMAL LOW (ref 6.0–8.3)
Total Protein: 4.8 g/dL — ABNORMAL LOW (ref 6.0–8.3)
Total Protein: 5.8 g/dL — ABNORMAL LOW (ref 6.0–8.3)

## 2011-04-30 LAB — CBC
HCT: 22 % — ABNORMAL LOW (ref 36.0–46.0)
HCT: 23.3 % — ABNORMAL LOW (ref 36.0–46.0)
HCT: 23.6 % — ABNORMAL LOW (ref 36.0–46.0)
HCT: 24.6 % — ABNORMAL LOW (ref 36.0–46.0)
HCT: 26.4 % — ABNORMAL LOW (ref 36.0–46.0)
HCT: 27.5 % — ABNORMAL LOW (ref 36.0–46.0)
HCT: 29.5 % — ABNORMAL LOW (ref 36.0–46.0)
HCT: 30.4 % — ABNORMAL LOW (ref 36.0–46.0)
HCT: 30.9 % — ABNORMAL LOW (ref 36.0–46.0)
HCT: 31.5 % — ABNORMAL LOW (ref 36.0–46.0)
HCT: 32 % — ABNORMAL LOW (ref 36.0–46.0)
HCT: 32.4 % — ABNORMAL LOW (ref 36.0–46.0)
Hemoglobin: 10 g/dL — ABNORMAL LOW (ref 12.0–15.0)
Hemoglobin: 10 g/dL — ABNORMAL LOW (ref 12.0–15.0)
Hemoglobin: 10.4 g/dL — ABNORMAL LOW (ref 12.0–15.0)
Hemoglobin: 10.5 g/dL — ABNORMAL LOW (ref 12.0–15.0)
Hemoglobin: 11 g/dL — ABNORMAL LOW (ref 12.0–15.0)
Hemoglobin: 7.5 g/dL — CL (ref 12.0–15.0)
Hemoglobin: 8 g/dL — ABNORMAL LOW (ref 12.0–15.0)
Hemoglobin: 8.9 g/dL — ABNORMAL LOW (ref 12.0–15.0)
MCHC: 32.2 g/dL (ref 30.0–36.0)
MCHC: 32.5 g/dL (ref 30.0–36.0)
MCHC: 32.5 g/dL (ref 30.0–36.0)
MCHC: 32.8 g/dL (ref 30.0–36.0)
MCHC: 32.9 g/dL (ref 30.0–36.0)
MCHC: 33 g/dL (ref 30.0–36.0)
MCHC: 33.1 g/dL (ref 30.0–36.0)
MCHC: 33.2 g/dL (ref 30.0–36.0)
MCHC: 33.3 g/dL (ref 30.0–36.0)
MCHC: 33.5 g/dL (ref 30.0–36.0)
MCHC: 33.8 g/dL (ref 30.0–36.0)
MCHC: 33.9 g/dL (ref 30.0–36.0)
MCHC: 33.9 g/dL (ref 30.0–36.0)
MCV: 77.9 fL — ABNORMAL LOW (ref 78.0–100.0)
MCV: 78 fL (ref 78.0–100.0)
MCV: 78.9 fL (ref 78.0–100.0)
MCV: 80 fL (ref 78.0–100.0)
MCV: 80.1 fL (ref 78.0–100.0)
MCV: 80.2 fL (ref 78.0–100.0)
MCV: 80.2 fL (ref 78.0–100.0)
Platelets: 201 10*3/uL (ref 150–400)
Platelets: 217 10*3/uL (ref 150–400)
Platelets: 222 10*3/uL (ref 150–400)
Platelets: 229 10*3/uL (ref 150–400)
Platelets: 230 10*3/uL (ref 150–400)
Platelets: 248 10*3/uL (ref 150–400)
Platelets: 251 10*3/uL (ref 150–400)
Platelets: 255 10*3/uL (ref 150–400)
Platelets: 293 10*3/uL (ref 150–400)
Platelets: 332 10*3/uL (ref 150–400)
RBC: 2.82 MIL/uL — ABNORMAL LOW (ref 3.87–5.11)
RBC: 2.82 MIL/uL — ABNORMAL LOW (ref 3.87–5.11)
RBC: 3.1 MIL/uL — ABNORMAL LOW (ref 3.87–5.11)
RBC: 3.44 MIL/uL — ABNORMAL LOW (ref 3.87–5.11)
RBC: 3.88 MIL/uL (ref 3.87–5.11)
RBC: 4.19 MIL/uL (ref 3.87–5.11)
RDW: 18.1 % — ABNORMAL HIGH (ref 11.5–15.5)
RDW: 18.3 % — ABNORMAL HIGH (ref 11.5–15.5)
RDW: 18.7 % — ABNORMAL HIGH (ref 11.5–15.5)
RDW: 18.7 % — ABNORMAL HIGH (ref 11.5–15.5)
RDW: 18.8 % — ABNORMAL HIGH (ref 11.5–15.5)
RDW: 19 % — ABNORMAL HIGH (ref 11.5–15.5)
RDW: 19.3 % — ABNORMAL HIGH (ref 11.5–15.5)
RDW: 19.3 % — ABNORMAL HIGH (ref 11.5–15.5)
RDW: 19.4 % — ABNORMAL HIGH (ref 11.5–15.5)
RDW: 19.8 % — ABNORMAL HIGH (ref 11.5–15.5)
WBC: 10.5 10*3/uL (ref 4.0–10.5)
WBC: 10.8 10*3/uL — ABNORMAL HIGH (ref 4.0–10.5)
WBC: 15.3 10*3/uL — ABNORMAL HIGH (ref 4.0–10.5)
WBC: 6.5 10*3/uL (ref 4.0–10.5)
WBC: 6.6 10*3/uL (ref 4.0–10.5)
WBC: 7.8 10*3/uL (ref 4.0–10.5)
WBC: 8.4 10*3/uL (ref 4.0–10.5)

## 2011-04-30 LAB — BASIC METABOLIC PANEL
BUN: 13 mg/dL (ref 6–23)
BUN: 17 mg/dL (ref 6–23)
BUN: 18 mg/dL (ref 6–23)
BUN: 19 mg/dL (ref 6–23)
BUN: 21 mg/dL (ref 6–23)
CO2: 18 mEq/L — ABNORMAL LOW (ref 19–32)
CO2: 19 mEq/L (ref 19–32)
CO2: 23 mEq/L (ref 19–32)
CO2: 25 mEq/L (ref 19–32)
CO2: 27 mEq/L (ref 19–32)
Calcium: 7.2 mg/dL — ABNORMAL LOW (ref 8.4–10.5)
Calcium: 7.8 mg/dL — ABNORMAL LOW (ref 8.4–10.5)
Calcium: 7.8 mg/dL — ABNORMAL LOW (ref 8.4–10.5)
Calcium: 8.4 mg/dL (ref 8.4–10.5)
Calcium: 8.6 mg/dL (ref 8.4–10.5)
Calcium: 9.2 mg/dL (ref 8.4–10.5)
Calcium: 9.4 mg/dL (ref 8.4–10.5)
Chloride: 102 mEq/L (ref 96–112)
Chloride: 103 mEq/L (ref 96–112)
Chloride: 104 mEq/L (ref 96–112)
Chloride: 104 mEq/L (ref 96–112)
Chloride: 109 mEq/L (ref 96–112)
Creatinine, Ser: 1.1 mg/dL (ref 0.4–1.2)
Creatinine, Ser: 1.14 mg/dL (ref 0.4–1.2)
Creatinine, Ser: 1.17 mg/dL (ref 0.4–1.2)
Creatinine, Ser: 1.32 mg/dL — ABNORMAL HIGH (ref 0.4–1.2)
GFR calc Af Amer: 33 mL/min — ABNORMAL LOW (ref >60)
GFR calc Af Amer: 60 mL/min — ABNORMAL LOW (ref >60)
GFR calc Af Amer: >60 mL/min (ref >60)
GFR calc non Af Amer: 43 mL/min — ABNORMAL LOW (ref >60)
GFR calc non Af Amer: 50 mL/min — ABNORMAL LOW (ref >60)
GFR calc non Af Amer: 50 mL/min — ABNORMAL LOW (ref >60)
GFR calc non Af Amer: 51 mL/min — ABNORMAL LOW (ref >60)
GFR calc non Af Amer: 52 mL/min — ABNORMAL LOW (ref >60)
GFR calc non Af Amer: 53 mL/min — ABNORMAL LOW (ref >60)
Glucose, Bld: 107 mg/dL — ABNORMAL HIGH (ref 70–99)
Glucose, Bld: 113 mg/dL — ABNORMAL HIGH (ref 70–99)
Glucose, Bld: 129 mg/dL — ABNORMAL HIGH (ref 70–99)
Glucose, Bld: 78 mg/dL (ref 70–99)
Glucose, Bld: 94 mg/dL (ref 70–99)
Potassium: 2.8 mEq/L — ABNORMAL LOW (ref 3.5–5.1)
Potassium: 3.2 mEq/L — ABNORMAL LOW (ref 3.5–5.1)
Potassium: 4.4 mEq/L (ref 3.5–5.1)
Potassium: 5.1 mEq/L (ref 3.5–5.1)
Potassium: 5.2 mEq/L — ABNORMAL HIGH (ref 3.5–5.1)
Potassium: 5.3 mEq/L — ABNORMAL HIGH (ref 3.5–5.1)
Sodium: 131 mEq/L — ABNORMAL LOW (ref 135–145)
Sodium: 134 mEq/L — ABNORMAL LOW (ref 135–145)
Sodium: 134 mEq/L — ABNORMAL LOW (ref 135–145)
Sodium: 135 mEq/L (ref 135–145)
Sodium: 135 mEq/L (ref 135–145)
Sodium: 136 mEq/L (ref 135–145)
Sodium: 137 mEq/L (ref 135–145)

## 2011-04-30 LAB — MAGNESIUM
Magnesium: 1.6 mg/dL (ref 1.5–2.5)
Magnesium: 1.9 mg/dL (ref 1.5–2.5)
Magnesium: 2 mg/dL (ref 1.5–2.5)
Magnesium: 2.2 mg/dL (ref 1.5–2.5)

## 2011-04-30 LAB — DIFFERENTIAL
Basophils Absolute: 0 10*3/uL (ref 0.0–0.1)
Basophils Absolute: 0 10*3/uL (ref 0.0–0.1)
Basophils Absolute: 0 10*3/uL (ref 0.0–0.1)
Basophils Absolute: 0.1 10*3/uL (ref 0.0–0.1)
Basophils Relative: 0 % (ref 0–1)
Basophils Relative: 1 % (ref 0–1)
Basophils Relative: 1 % (ref 0–1)
Eosinophils Absolute: 0 10*3/uL (ref 0.0–0.7)
Eosinophils Absolute: 0.3 10*3/uL (ref 0.0–0.7)
Eosinophils Absolute: 0.3 10*3/uL (ref 0.0–0.7)
Eosinophils Relative: 0 % (ref 0–5)
Eosinophils Relative: 2 % (ref 0–5)
Eosinophils Relative: 2 % (ref 0–5)
Eosinophils Relative: 4 % (ref 0–5)
Eosinophils Relative: 4 % (ref 0–5)
Lymphocytes Relative: 10 % — ABNORMAL LOW (ref 12–46)
Lymphocytes Relative: 10 % — ABNORMAL LOW (ref 12–46)
Lymphocytes Relative: 16 % (ref 12–46)
Lymphocytes Relative: 28 % (ref 12–46)
Lymphocytes Relative: 30 % (ref 12–46)
Lymphs Abs: 0.8 10*3/uL (ref 0.7–4.0)
Lymphs Abs: 1.5 10*3/uL (ref 0.7–4.0)
Lymphs Abs: 1.7 10*3/uL (ref 0.7–4.0)
Lymphs Abs: 1.7 10*3/uL (ref 0.7–4.0)
Lymphs Abs: 1.8 10*3/uL (ref 0.7–4.0)
Lymphs Abs: 2.3 10*3/uL (ref 0.7–4.0)
Monocytes Absolute: 0.7 10*3/uL (ref 0.1–1.0)
Monocytes Absolute: 0.7 10*3/uL (ref 0.1–1.0)
Monocytes Absolute: 0.9 10*3/uL (ref 0.1–1.0)
Monocytes Absolute: 0.9 10*3/uL (ref 0.1–1.0)
Monocytes Relative: 10 % (ref 3–12)
Monocytes Relative: 4 % (ref 3–12)
Monocytes Relative: 6 % (ref 3–12)
Monocytes Relative: 9 % (ref 3–12)
Neutro Abs: 12.5 10*3/uL — ABNORMAL HIGH (ref 1.7–7.7)
Neutro Abs: 16.9 10*3/uL — ABNORMAL HIGH (ref 1.7–7.7)
Neutro Abs: 3.7 10*3/uL (ref 1.7–7.7)
Neutro Abs: 4.4 10*3/uL (ref 1.7–7.7)
Neutro Abs: 7.5 10*3/uL (ref 1.7–7.7)
Neutrophils Relative %: 56 % (ref 43–77)
Neutrophils Relative %: 57 % (ref 43–77)
Neutrophils Relative %: 71 % (ref 43–77)
Neutrophils Relative %: 90 % — ABNORMAL HIGH (ref 43–77)
Neutrophils Relative %: 90 % — ABNORMAL HIGH (ref 43–77)

## 2011-04-30 LAB — TYPE AND SCREEN

## 2011-04-30 LAB — GLUCOSE, CAPILLARY
Glucose-Capillary: 104 mg/dL — ABNORMAL HIGH (ref 70–99)
Glucose-Capillary: 112 mg/dL — ABNORMAL HIGH (ref 70–99)
Glucose-Capillary: 112 mg/dL — ABNORMAL HIGH (ref 70–99)
Glucose-Capillary: 113 mg/dL — ABNORMAL HIGH (ref 70–99)
Glucose-Capillary: 121 mg/dL — ABNORMAL HIGH (ref 70–99)
Glucose-Capillary: 124 mg/dL — ABNORMAL HIGH (ref 70–99)
Glucose-Capillary: 124 mg/dL — ABNORMAL HIGH (ref 70–99)
Glucose-Capillary: 126 mg/dL — ABNORMAL HIGH (ref 70–99)
Glucose-Capillary: 127 mg/dL — ABNORMAL HIGH (ref 70–99)
Glucose-Capillary: 128 mg/dL — ABNORMAL HIGH (ref 70–99)
Glucose-Capillary: 133 mg/dL — ABNORMAL HIGH (ref 70–99)
Glucose-Capillary: 133 mg/dL — ABNORMAL HIGH (ref 70–99)
Glucose-Capillary: 136 mg/dL — ABNORMAL HIGH (ref 70–99)
Glucose-Capillary: 139 mg/dL — ABNORMAL HIGH (ref 70–99)
Glucose-Capillary: 140 mg/dL — ABNORMAL HIGH (ref 70–99)
Glucose-Capillary: 140 mg/dL — ABNORMAL HIGH (ref 70–99)
Glucose-Capillary: 145 mg/dL — ABNORMAL HIGH (ref 70–99)
Glucose-Capillary: 148 mg/dL — ABNORMAL HIGH (ref 70–99)
Glucose-Capillary: 151 mg/dL — ABNORMAL HIGH (ref 70–99)
Glucose-Capillary: 152 mg/dL — ABNORMAL HIGH (ref 70–99)
Glucose-Capillary: 166 mg/dL — ABNORMAL HIGH (ref 70–99)
Glucose-Capillary: 167 mg/dL — ABNORMAL HIGH (ref 70–99)
Glucose-Capillary: 181 mg/dL — ABNORMAL HIGH (ref 70–99)
Glucose-Capillary: 84 mg/dL (ref 70–99)
Glucose-Capillary: 88 mg/dL (ref 70–99)
Glucose-Capillary: 93 mg/dL (ref 70–99)
Glucose-Capillary: 94 mg/dL (ref 70–99)

## 2011-04-30 LAB — HEMOGLOBINOPATHY EVALUATION
Hemoglobin Other: 0 % (ref 0.0–0.0)
Hemoglobin Other: 0 % (ref 0.0–0.0)
Hgb A2 Quant: 2.9 % (ref 2.2–3.2)
Hgb A2 Quant: 3 % (ref 2.2–3.2)
Hgb A: 96.4 % — ABNORMAL LOW (ref 96.8–97.8)
Hgb F Quant: 0.6 % — ABNORMAL HIGH (ref 0.0–2.0)
Hgb F Quant: 0.7 % — ABNORMAL HIGH (ref 0.0–2.0)
Hgb S Quant: 0 % (ref 0.0–0.0)

## 2011-04-30 LAB — URINALYSIS, ROUTINE W REFLEX MICROSCOPIC
Bilirubin Urine: NEGATIVE
Glucose, UA: NEGATIVE mg/dL
Ketones, ur: NEGATIVE mg/dL
Leukocytes, UA: NEGATIVE
Nitrite: NEGATIVE
Nitrite: NEGATIVE
Protein, ur: 100 mg/dL — AB
Specific Gravity, Urine: 1.029 (ref 1.005–1.030)
Urobilinogen, UA: 0.2 mg/dL (ref 0.0–1.0)
pH: 6 (ref 5.0–8.0)

## 2011-04-30 LAB — BLOOD GAS, ARTERIAL
Bicarbonate: 17.5 mEq/L — ABNORMAL LOW (ref 20.0–24.0)
O2 Saturation: 96.1 %
Patient temperature: 98.6
TCO2: 18.5 mmol/L (ref 0–100)
pH, Arterial: 7.376 (ref 7.350–7.400)
pO2, Arterial: 78.4 mmHg — ABNORMAL LOW (ref 80.0–100.0)

## 2011-04-30 LAB — URINE CULTURE
Colony Count: NO GROWTH
Special Requests: NEGATIVE

## 2011-04-30 LAB — URINALYSIS, MICROSCOPIC ONLY
Glucose, UA: NEGATIVE mg/dL
Ketones, ur: NEGATIVE mg/dL
Nitrite: NEGATIVE
Protein, ur: 30 mg/dL — AB

## 2011-04-30 LAB — POCT I-STAT, CHEM 8
BUN: 5 mg/dL — ABNORMAL LOW (ref 6–23)
Creatinine, Ser: 0.9 mg/dL (ref 0.4–1.2)
Potassium: 2.9 mEq/L — ABNORMAL LOW (ref 3.5–5.1)
Sodium: 139 mEq/L (ref 135–145)
TCO2: 24 mmol/L (ref 0–100)

## 2011-04-30 LAB — CLOSTRIDIUM DIFFICILE EIA
C difficile Toxins A+B, EIA: NEGATIVE
C difficile Toxins A+B, EIA: NEGATIVE

## 2011-04-30 LAB — CROSSMATCH: Antibody Screen: NEGATIVE

## 2011-04-30 LAB — CHOLESTEROL, TOTAL
Cholesterol: 100 mg/dL (ref 0–200)
Cholesterol: 64 mg/dL (ref 0–200)
Cholesterol: 75 mg/dL (ref 0–200)

## 2011-04-30 LAB — PHOSPHORUS
Phosphorus: 2.5 mg/dL (ref 2.3–4.6)
Phosphorus: 3.2 mg/dL (ref 2.3–4.6)

## 2011-04-30 LAB — HEPATIC FUNCTION PANEL
Alkaline Phosphatase: 31 U/L — ABNORMAL LOW (ref 39–117)
Indirect Bilirubin: 0.7 mg/dL (ref 0.3–0.9)
Total Protein: 6.3 g/dL (ref 6.0–8.3)

## 2011-04-30 LAB — PREALBUMIN
Prealbumin: 27.5 mg/dL (ref 18.0–45.0)
Prealbumin: 4.5 mg/dL — ABNORMAL LOW (ref 18.0–45.0)

## 2011-04-30 LAB — TRIGLYCERIDES
Triglycerides: 100 mg/dL (ref <150)
Triglycerides: 64 mg/dL (ref <150)

## 2011-04-30 LAB — URINE MICROSCOPIC-ADD ON

## 2011-04-30 LAB — IRON AND TIBC
Saturation Ratios: 12 % — ABNORMAL LOW (ref 20–55)
UIBC: 125 ug/dL

## 2011-04-30 LAB — LIPASE, BLOOD: Lipase: 10 U/L — ABNORMAL LOW (ref 11–59)

## 2011-04-30 LAB — STOOL CULTURE

## 2011-04-30 LAB — FERRITIN: Ferritin: 90 ng/mL (ref 10–291)

## 2014-04-19 ENCOUNTER — Encounter: Payer: Self-pay | Admitting: Internal Medicine

## 2014-04-19 NOTE — Telephone Encounter (Signed)
Error
